# Patient Record
Sex: Female | Born: 1990 | Race: White | Hispanic: No | Marital: Single | Smoking: Never smoker
Health system: Southern US, Community
[De-identification: ages and names within clinical notes are randomized; demographics above are authoritative.]

---

## 2016-07-26 ENCOUNTER — Encounter (HOSPITAL_COMMUNITY): Payer: Self-pay

## 2016-07-26 ENCOUNTER — Emergency Department (HOSPITAL_COMMUNITY)
Admission: EM | Admit: 2016-07-26 | Discharge: 2016-07-27 | Disposition: A | Discharge status: Home / Self Care | Payer: 59 | Attending: Emergency Medicine | Admitting: Emergency Medicine

## 2016-07-26 DIAGNOSIS — M545 Low back pain: Secondary | ICD-10-CM | POA: Diagnosis not present

## 2016-07-26 DIAGNOSIS — N83291 Other ovarian cyst, right side: Secondary | ICD-10-CM | POA: Insufficient documentation

## 2016-07-26 DIAGNOSIS — Z79899 Other long term (current) drug therapy: Secondary | ICD-10-CM | POA: Diagnosis not present

## 2016-07-26 DIAGNOSIS — R102 Pelvic and perineal pain: Secondary | ICD-10-CM

## 2016-07-26 DIAGNOSIS — N83209 Unspecified ovarian cyst, unspecified side: Secondary | ICD-10-CM

## 2016-07-26 LAB — CBC
HCT: 37.6 % (ref 36.0–46.0)
HEMOGLOBIN: 13.8 g/dL (ref 12.0–15.0)
MCH: 32.9 pg (ref 26.0–34.0)
MCHC: 36.7 g/dL — ABNORMAL HIGH (ref 30.0–36.0)
MCV: 89.7 fL (ref 78.0–100.0)
Platelets: 208 10*3/uL (ref 150–400)
RBC: 4.19 MIL/uL (ref 3.87–5.11)
RDW: 11.7 % (ref 11.5–15.5)
WBC: 9.1 10*3/uL (ref 4.0–10.5)

## 2016-07-26 NOTE — ED Triage Notes (Signed)
Pt BIB [facility] from hotel ([place] She started experiencing severe groin and pelvic pain following dinner. She is unable to sit. She had her IUD removed 3 months ago. She denies any chance of pregnancy. 100 mcg fentanyl given en route. A&Ox4.

## 2016-07-27 ENCOUNTER — Emergency Department (HOSPITAL_COMMUNITY): Payer: 59

## 2016-07-27 LAB — COMPREHENSIVE METABOLIC PANEL WITH GFR
ALT: 15 U/L (ref 14–54)
AST: 19 U/L (ref 15–41)
Albumin: 4.4 g/dL (ref 3.5–5.0)
Alkaline Phosphatase: 35 U/L — ABNORMAL LOW (ref 38–126)
Anion gap: 7 (ref 5–15)
BUN: 12 mg/dL (ref 6–20)
CO2: 25 mmol/L (ref 22–32)
Calcium: 9.3 mg/dL (ref 8.9–10.3)
Chloride: 108 mmol/L (ref 101–111)
Creatinine, Ser: 0.63 mg/dL (ref 0.44–1.00)
GFR calc Af Amer: >60 mL/min (ref >60)
GFR calc non Af Amer: >60 mL/min (ref >60)
Glucose, Bld: 110 mg/dL — ABNORMAL HIGH (ref 65–99)
Potassium: 3.8 mmol/L (ref 3.5–5.1)
Sodium: 140 mmol/L (ref 135–145)
Total Bilirubin: 0.4 mg/dL (ref 0.3–1.2)
Total Protein: 7.1 g/dL (ref 6.5–8.1)

## 2016-07-27 LAB — WET PREP, GENITAL
Clue Cells Wet Prep HPF POC: NONE SEEN
SPERM: NONE SEEN
Trich, Wet Prep: NONE SEEN
YEAST WET PREP: NONE SEEN

## 2016-07-27 LAB — URINALYSIS, ROUTINE W REFLEX MICROSCOPIC
Bilirubin Urine: NEGATIVE
Glucose, UA: NEGATIVE mg/dL
Ketones, ur: 5 mg/dL — AB
Leukocytes, UA: NEGATIVE
Nitrite: NEGATIVE
Protein, ur: NEGATIVE mg/dL
Specific Gravity, Urine: 1.018 (ref 1.005–1.030)
pH: 7 (ref 5.0–8.0)

## 2016-07-27 LAB — GC/CHLAMYDIA PROBE AMP (~~LOC~~) NOT AT ARMC
Chlamydia: NEGATIVE
NEISSERIA GONORRHEA: NEGATIVE

## 2016-07-27 LAB — LIPASE, BLOOD: Lipase: 25 U/L (ref 11–51)

## 2016-07-27 LAB — I-STAT BETA HCG BLOOD, ED (MC, WL, AP ONLY): I-stat hCG, quantitative: <5 m[IU]/mL (ref <5)

## 2016-07-27 MED ORDER — NAPROXEN 375 MG PO TABS: 375.0000 mg | ORAL_TABLET | Freq: Two times a day (BID) | ORAL | 0 refills | Status: AC

## 2016-07-27 MED ORDER — MORPHINE SULFATE (PF) 4 MG/ML IV SOLN
4.0000 mg | Freq: Once | INTRAVENOUS | Status: AC
  Administered 2016-07-27: 4 mg via INTRAVENOUS
  Filled 2016-07-27: qty 1

## 2016-07-27 MED ORDER — HYDROCODONE-ACETAMINOPHEN 5-325 MG PO TABS: 1.0000 | ORAL_TABLET | ORAL | 0 refills | Status: AC | PRN

## 2016-07-27 NOTE — ED Provider Notes (Signed)
[facility]-EMERGENCY DEPT Provider Note   CSN: [id number] Arrival date & time: 07/26/16  2246     History   Chief Complaint Chief Complaint  Patient presents with  . Pelvic Pain    HPI Desiree Ramos is a 26 y.o. female.  Patient presents with severe pelvic pain that started suddenly at 8:30 pm last night (07/26/16) while at rest that has remained constant since onset. No vaginal discharge, bleeding, dysuria. No fever. She vomited x 1 and feels this was from the intensity of the pain. It is uncomfortable to sit, and there are no comfortable positions. She tried taking a hot bath without relief. There is pain that radiates through to the back but significant less intense as pelvic pain.   The history is provided by the patient. No language interpreter was used.  Pelvic Pain     History reviewed. No pertinent past medical history.  There are no active problems to display for this patient.   History reviewed. No pertinent surgical history.  OB History    No data available       Home Medications    Prior to Admission medications   Medication Sig Start Date End Date Taking? Authorizing Provider  Docosahexaenoic Acid (PRENATAL DHA PO) Take 1 tablet by mouth 2 (two) times daily.   Yes [provider]  loratadine (CLARITIN) 10 MG tablet Take 10 mg by mouth daily. 05/15/16  Yes [provider]  Prenatal Vit-Fe Fumarate-FA (PRENATAL PO) Take 1 capsule by mouth 3 (three) times daily.   Yes [provider]    Family History History reviewed. No pertinent family history.  Social History Social History  Substance Use Topics  . Smoking status: Never Smoker  . Smokeless tobacco: Never Used  . Alcohol use Not on file     Allergies   Patient has no known allergies.   Review of Systems Review of Systems  Constitutional: Negative for chills and fever.  Respiratory: Negative.   Gastrointestinal: Positive for vomiting.  Genitourinary: Positive for pelvic  pain. Negative for dysuria, flank pain, vaginal bleeding and vaginal discharge.  Musculoskeletal: Positive for back pain.  Skin: Negative.   Neurological: Negative.      Physical Exam Updated Vital Signs BP 93/69 (BP Location: Right Arm)   Pulse 91   Temp 97.8 F (36.6 C) (Oral)   Resp 18   LMP 07/06/2016   SpO2 100%   Physical Exam  Constitutional: She appears well-developed and well-nourished.  HENT:  Head: Normocephalic.  Neck: Normal range of motion. Neck supple.  Cardiovascular: Normal rate.   Pulmonary/Chest: Effort normal.  Abdominal: Soft. Bowel sounds are normal. There is tenderness. There is guarding. There is no rebound.    Genitourinary:  Genitourinary Comments: Moderate vaginal discharge present. There is CMT and right adnexal tenderness that is significant. Left adnexa less tender. No palpable mass.   Musculoskeletal: Normal range of motion.  Neurological: She is alert. No cranial nerve deficit.  Skin: Skin is warm and dry. No rash noted.  Psychiatric: She has a normal mood and affect.  Nursing note and vitals reviewed.    ED Treatments / Results  Labs (all labs ordered are listed, but only abnormal results are displayed) Labs Reviewed  COMPREHENSIVE METABOLIC PANEL - Abnormal; Notable for the following:       Result Value   Glucose, Bld 110 (*)    Alkaline Phosphatase 35 (*)    All other components within normal limits  CBC - Abnormal; Notable for  the following:    MCHC 36.7 (*)    All other components within normal limits  URINALYSIS, ROUTINE W REFLEX MICROSCOPIC - Abnormal; Notable for the following:    Hgb urine dipstick SMALL (*)    Ketones, ur 5 (*)    Bacteria, UA RARE (*)    Squamous Epithelial / LPF 0-5 (*)    All other components within normal limits  WET PREP, GENITAL  LIPASE, BLOOD  I-STAT BETA HCG BLOOD, ED ([facility], [facility], [facility] ONLY)  GC/CHLAMYDIA PROBE AMP ([place]) NOT AT [facility]    EKG  EKG Interpretation None        Radiology No results found.  Procedures Procedures (including critical care time)  Medications Ordered in ED Medications  morphine 4 MG/ML injection 4 mg (4 mg Intravenous Given 07/27/16 0313)     Initial Impression / Assessment and Plan / ED Course  I have reviewed the triage vital signs and the nursing notes.  Pertinent labs & imaging results that were available during my care of the patient were reviewed by me and considered in my medical decision making (see chart for details).     Patient has sudden onset severe R>L pelvic pain. No nausea - one episode vomiting felt secondary to pain. No vaginal d/ch, bleeding. No dysuria. History of ovarian cysts.  Pain to exam is felt to be pelvic. There is evidence of ruptured right ovarian cyst on US - no torsion. There is no leukocytosis. Doubt appendix.   Pain is managed in ED with morphine. Will d/ch home with naproxen.   Final Clinical Impressions(s) / ED Diagnoses   Final diagnoses:  Pelvic pain  Pelvic pain   1. Ruptured right ovarian cyst  New Prescriptions New Prescriptions   No medications on file     Danne HarborUpstill, Cobey Raineri, PA-C 07/29/16 0259    Molpus, Jonny RuizJohn, MD 07/31/16 2235

## 2019-04-30 IMAGING — US US TRANSVAGINAL NON-OB
1 series · 13 of 25 positions shown · non-contrast
Comparison: None.

CLINICAL DATA: Initial evaluation for acute severe pelvic pain.

EXAM:
TRANSABDOMINAL AND TRANSVAGINAL ULTRASOUND OF PELVIS
DOPPLER ULTRASOUND OF OVARIES
TECHNIQUE: Both transabdominal and transvaginal ultrasound examinations of the
pelvis were performed. Transabdominal technique was performed for
global imaging of the pelvis including uterus, ovaries, adnexal
regions, and pelvic cul-de-sac.
It was necessary to proceed with endovaginal exam following the
transabdominal exam to visualize the uterus and ovaries. Color and
duplex Doppler ultrasound was utilized to evaluate blood flow to the
ovaries.

[Series 1: us transvaginal non-ob · 0.21mm/px · 13 of 133 slices shown]
[im 1/133]
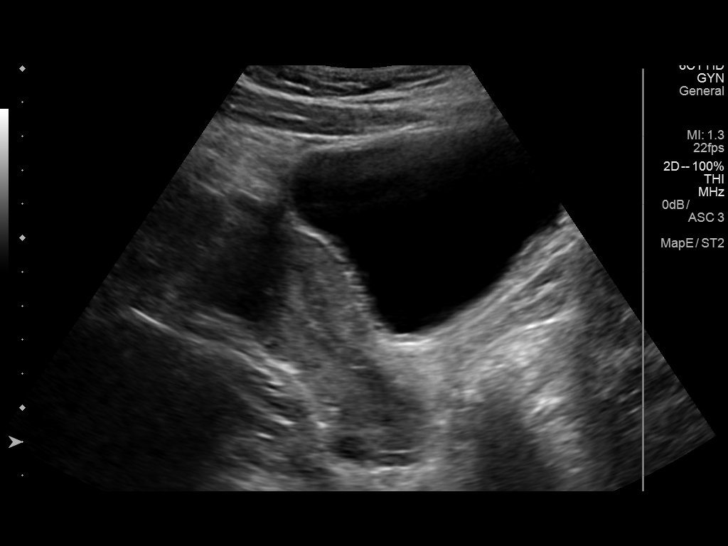
[im 12/133]
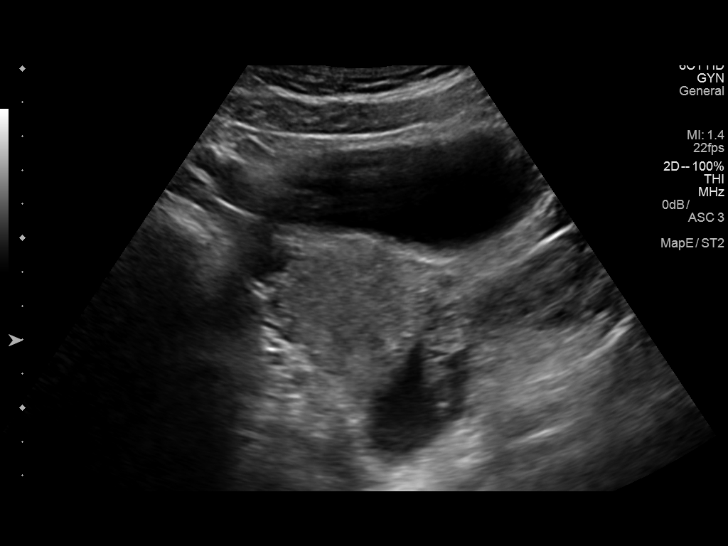
[im 23/133]
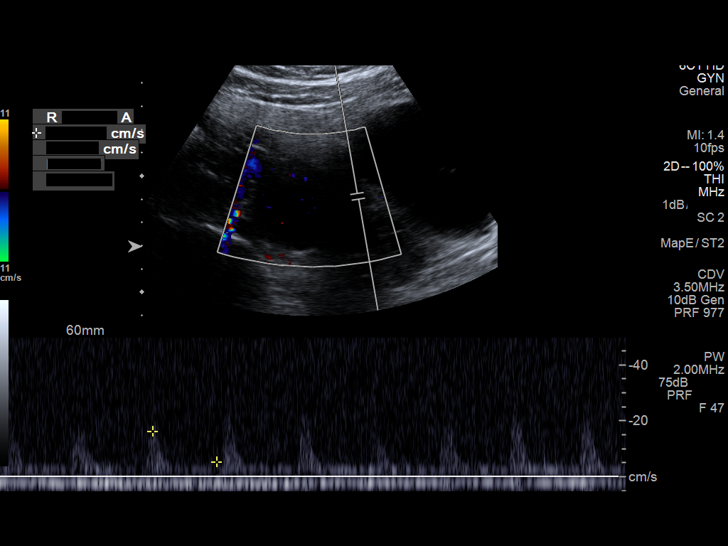
[im 34/133]
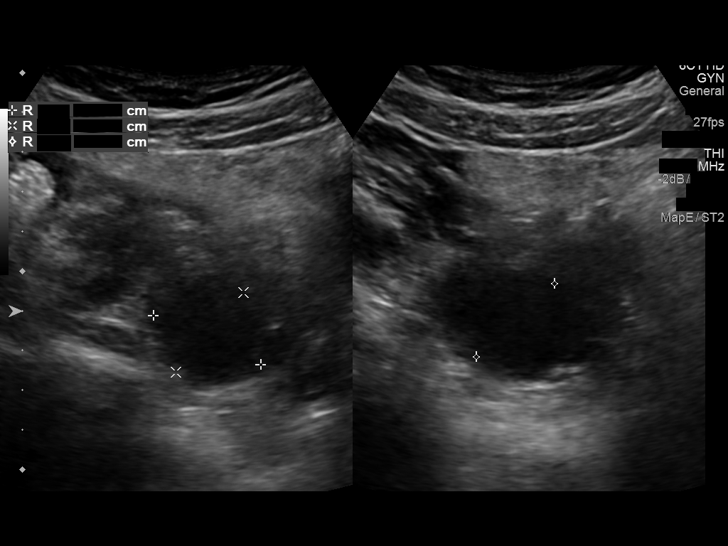
[im 45/133]
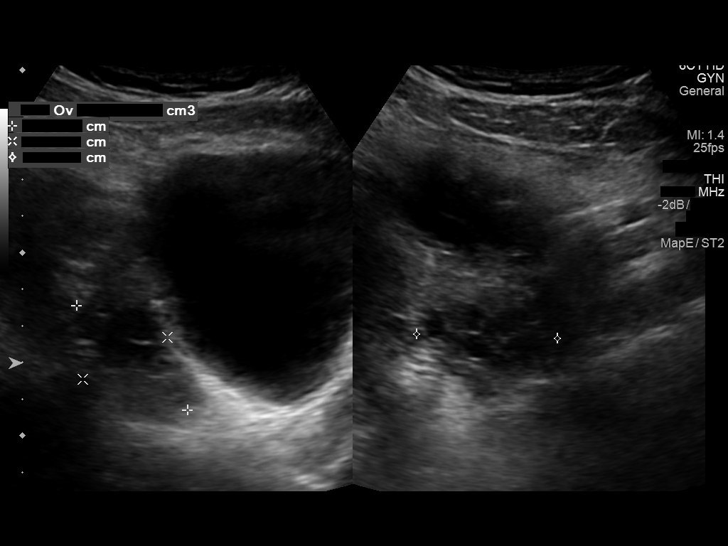
[im 56/133]
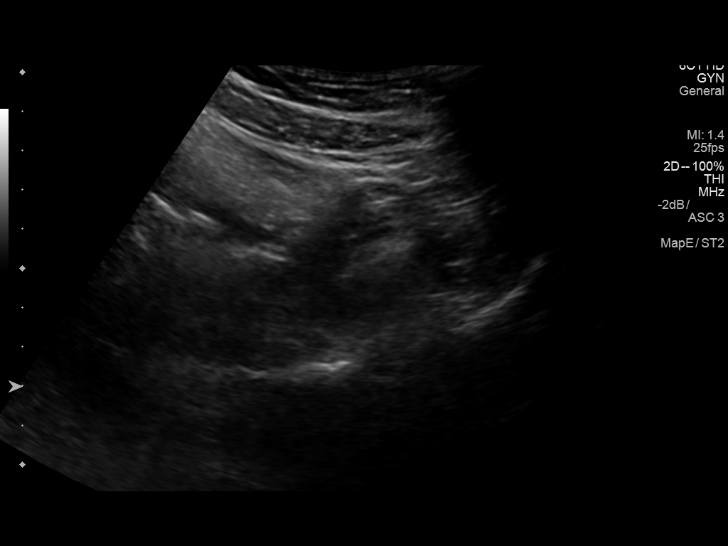
[im 67/133]
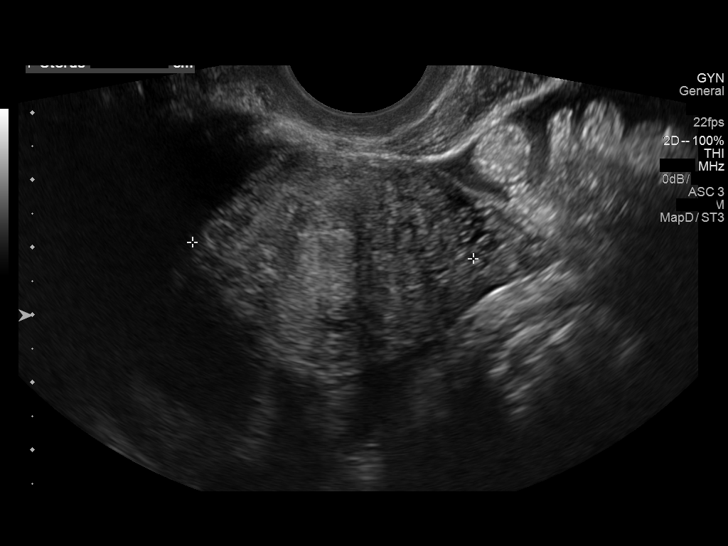
[im 78/133]
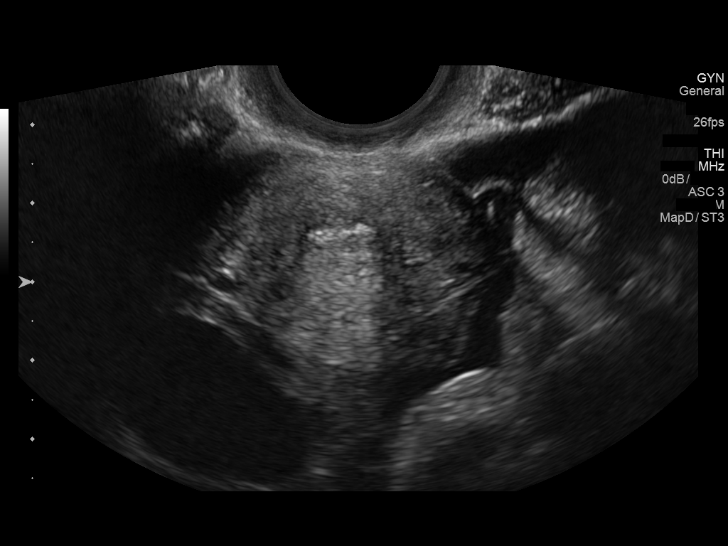
[im 89/133]
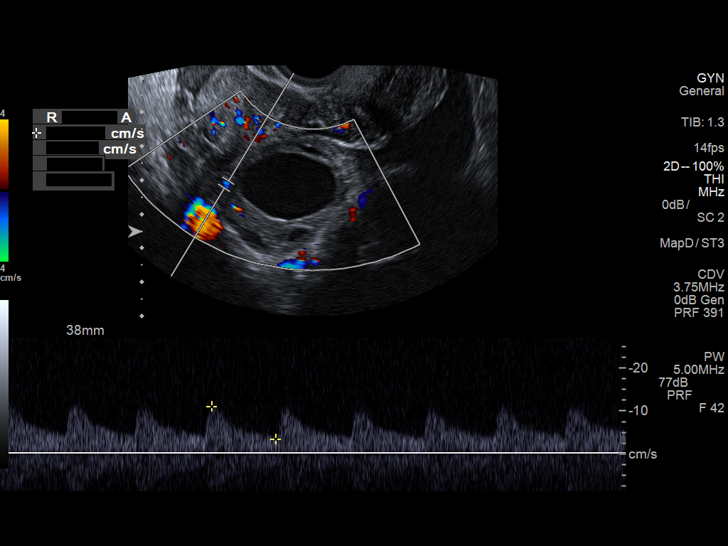
[im 100/133]
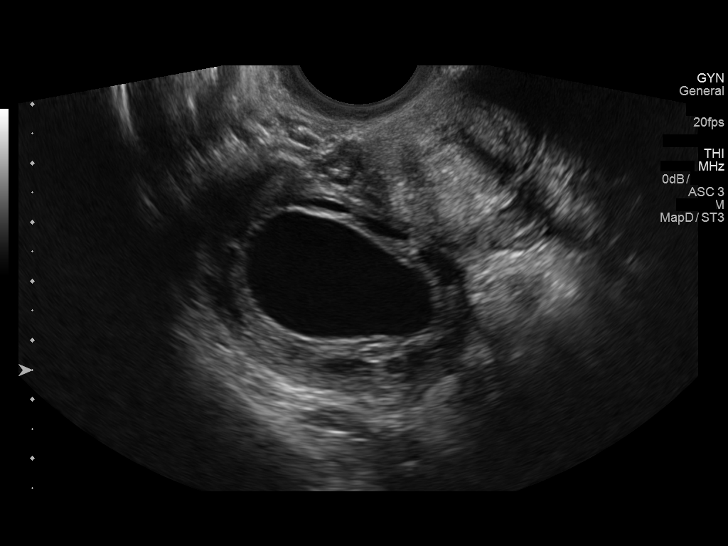
[im 111/133]
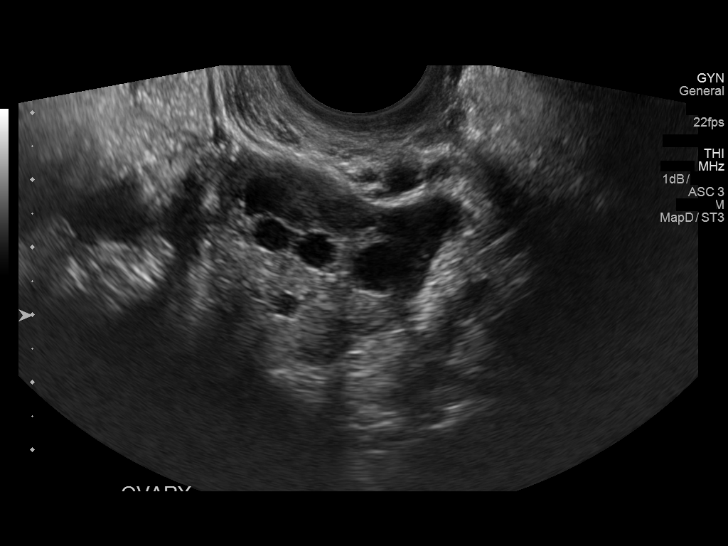
[im 122/133]
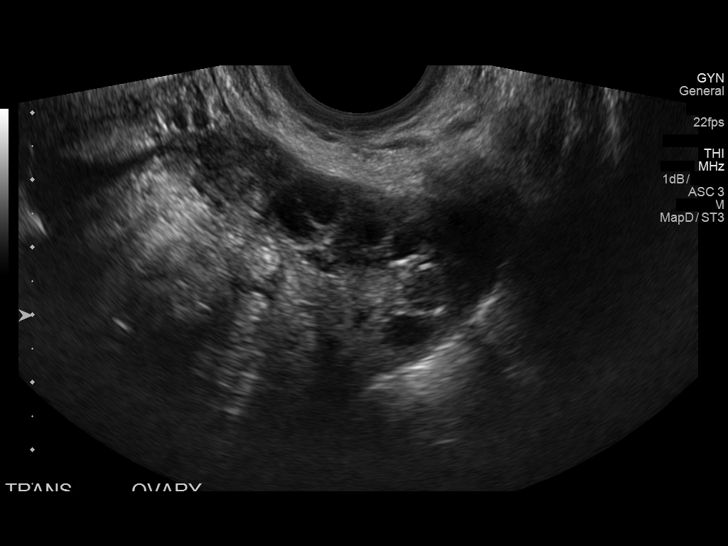
[im 133/133]
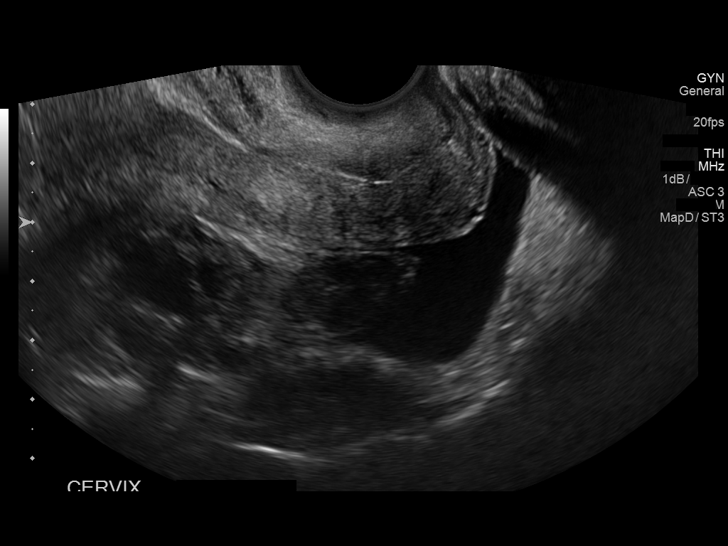

[13 of 25 positions shown; findings below may reference images not displayed]

FINDINGS: Uterus

Measurements: 7.5 x 3.5 x 4.2 cm. No fibroids or other mass
visualized.

Endometrium

Thickness: 6.4 mm.  No focal abnormality visualized.

Right ovary

Measurements: 4.3 x 3.2 x 3.4 cm. Normal appearance/no adnexal mass.
3.1 x 2.0 x 2.1 cm simple cysts, most consistent with a normal
physiologic cyst/dominant follicle.

Left ovary

Measurements: 3.3 x 2.8 x 2.6 cm. Normal appearance/no adnexal mass.

Pulsed Doppler evaluation of both ovaries demonstrates normal
low-resistance arterial and venous waveforms.

Other findings

Moderate free fluid within the pelvis, somewhat complex in
appearance and possibly containing blood. Finding may reflect
sequelae of a recently ruptured cyst.
IMPRESSION: 1. Moderate volume complex free fluid within the pelvis. Finding may
reflect the sequelae of a recently ruptured cyst.
2. 3.1 x 2.0 x 2.1 cm right ovarian cyst, most consistent with a
normal physiologic cyst.
3. Otherwise unremarkable pelvic ultrasound. No evidence for ovarian
torsion.
